# Patient Record
Sex: Male | Born: 1995 | Race: White | Hispanic: No | Marital: Single | Smoking: Never smoker
Health system: Southern US, Community
[De-identification: ages and names within clinical notes are randomized; demographics above are authoritative.]

## PROBLEM LIST (undated history)

## (undated) HISTORY — PX: SPINE SURGERY: SHX786

---
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
## 2018-11-01 ENCOUNTER — Encounter (HOSPITAL_COMMUNITY): Payer: Self-pay | Admitting: Emergency Medicine

## 2018-11-01 ENCOUNTER — Ambulatory Visit (HOSPITAL_COMMUNITY)
Admission: EM | Admit: 2018-11-01 | Discharge: 2018-11-01 | Disposition: A | Discharge status: Home / Self Care | Payer: BLUE CROSS/BLUE SHIELD | Attending: Family Medicine | Admitting: Family Medicine

## 2018-11-01 DIAGNOSIS — T148XXA Other injury of unspecified body region, initial encounter: Secondary | ICD-10-CM

## 2018-11-01 NOTE — ED Triage Notes (Signed)
Pt states he was putting up a tent and a large splinter went into his L thumb outside the nailbed. Happened 3 hours PTA

## 2018-11-01 NOTE — Discharge Instructions (Addendum)
Soak in soapy water daily Apply antibiotic ointment and a band aid Watch for infection Return as needed

## 2018-11-02 NOTE — ED Provider Notes (Signed)
[facility]    CSN: [id number] Arrival date & time: 11/01/18  1912     History   Chief Complaint No chief complaint on file.   HPI Harry Blair is a 23 y.o. male.   HPI  here for splinter under thumb nail Tried to remove but it comes out in pieces Tetanus up to date  History reviewed. No pertinent past medical history.  There are no active problems to display for this patient.   Past Surgical History:  Procedure Laterality Date  . SPINE SURGERY         Home Medications    Prior to Admission medications   Not on File    Family History Family History  Family history unknown: Yes    Social History Social History   Tobacco Use  . Smoking status: Never Smoker  Substance Use Topics  . Alcohol use: Yes  . Drug use: Never     Allergies   Patient has no known allergies.   Review of Systems Review of Systems  Constitutional: Negative for chills and fever.  HENT: Negative for ear pain and sore throat.   Eyes: Negative for pain and visual disturbance.  Respiratory: Negative for cough and shortness of breath.   Cardiovascular: Negative for chest pain and palpitations.  Gastrointestinal: Negative for abdominal pain and vomiting.  Genitourinary: Negative for dysuria and hematuria.  Musculoskeletal: Negative for arthralgias and back pain.  Skin: Positive for wound. Negative for color change and rash.  Neurological: Negative for seizures and syncope.  All other systems reviewed and are negative.    Physical Exam Triage Vital Signs ED Triage Vitals  Enc Vitals Group     BP 11/01/18 1927 135/88     Pulse Rate 11/01/18 1927 95     Resp 11/01/18 1927 16     Temp 11/01/18 1927 98.2 F (36.8 C)     Temp src --      SpO2 11/01/18 1927 100 %     Weight --      Height --      Head Circumference --      Peak Flow --      Pain Score 11/01/18 1929 8     Pain Loc --      Pain Edu? --      Excl. in GC? --    No data found.  Updated Vital  Signs BP 135/88   Pulse 95   Temp 98.2 F (36.8 C)   Resp 16   SpO2 100%      Physical Exam Constitutional:      General: He is not in acute distress.    Appearance: He is well-developed.  HENT:     Head: Normocephalic and atraumatic.  Eyes:     Conjunctiva/sclera: Conjunctivae normal.     Pupils: Pupils are equal, round, and reactive to light.  Neck:     Musculoskeletal: Normal range of motion.  Cardiovascular:     Rate and Rhythm: Normal rate.  Pulmonary:     Effort: Pulmonary effort is normal. No respiratory distress.  Abdominal:     General: There is no distension.     Palpations: Abdomen is soft.  Musculoskeletal: Normal range of motion.  Skin:    General: Skin is warm and dry.     Comments: Under the right thumb nail there is a splinter visible to the cuticle, approx 3 mm across  Neurological:     Mental Status: He is alert.  area cleansed/soaked for 15 min in soapy water Painted with alcohol anesthestized with 2 cc of 1 % lidocaine Removed with forceps bandaged    UC Treatments / Results  Labs (all labs ordered are listed, but only abnormal results are displayed) Labs Reviewed - No data to display  EKG None  Radiology No results found.  Procedures Procedures (including critical care time)  Medications Ordered in UC Medications - No data to display  Initial Impression / Assessment and Plan / UC Course  I have reviewed the triage vital signs and the nursing notes.  Pertinent labs & imaging results that were available during my care of the patient were reviewed by me and considered in my medical decision making (see chart for details).       Final Clinical Impressions(s) / UC Diagnoses   Final diagnoses:  Splinter in skin     Discharge Instructions     Soak in soapy water daily Apply antibiotic ointment and a band aid Watch for infection Return as needed    ED Prescriptions    None     Controlled Substance Prescriptions [place]  Controlled Substance Registry consulted? Not Applicable   Eustace Moore, MD 11/02/18 [phone]
